# Patient Record
Sex: Male | Born: 2000 | Race: White | Hispanic: No | Marital: Single | Smoking: Never smoker
Health system: Southern US, Community
[De-identification: ages and names within clinical notes are randomized; demographics above are authoritative.]

## PROBLEM LIST (undated history)

## (undated) HISTORY — PX: TYMPANOSTOMY TUBE PLACEMENT: SHX32

---
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
## 2021-02-28 DIAGNOSIS — Z419 Encounter for procedure for purposes other than remedying health state, unspecified: Secondary | ICD-10-CM | POA: Diagnosis not present

## 2021-03-31 DIAGNOSIS — Z419 Encounter for procedure for purposes other than remedying health state, unspecified: Secondary | ICD-10-CM | POA: Diagnosis not present

## 2021-05-01 DIAGNOSIS — Z419 Encounter for procedure for purposes other than remedying health state, unspecified: Secondary | ICD-10-CM | POA: Diagnosis not present

## 2021-05-31 DIAGNOSIS — Z419 Encounter for procedure for purposes other than remedying health state, unspecified: Secondary | ICD-10-CM | POA: Diagnosis not present

## 2021-07-01 DIAGNOSIS — Z419 Encounter for procedure for purposes other than remedying health state, unspecified: Secondary | ICD-10-CM | POA: Diagnosis not present

## 2021-07-31 DIAGNOSIS — Z419 Encounter for procedure for purposes other than remedying health state, unspecified: Secondary | ICD-10-CM | POA: Diagnosis not present

## 2021-08-31 DIAGNOSIS — Z419 Encounter for procedure for purposes other than remedying health state, unspecified: Secondary | ICD-10-CM | POA: Diagnosis not present

## 2021-10-01 DIAGNOSIS — Z419 Encounter for procedure for purposes other than remedying health state, unspecified: Secondary | ICD-10-CM | POA: Diagnosis not present

## 2021-10-29 DIAGNOSIS — Z419 Encounter for procedure for purposes other than remedying health state, unspecified: Secondary | ICD-10-CM | POA: Diagnosis not present

## 2021-11-29 DIAGNOSIS — Z419 Encounter for procedure for purposes other than remedying health state, unspecified: Secondary | ICD-10-CM | POA: Diagnosis not present

## 2021-12-29 DIAGNOSIS — Z419 Encounter for procedure for purposes other than remedying health state, unspecified: Secondary | ICD-10-CM | POA: Diagnosis not present

## 2022-01-29 DIAGNOSIS — Z419 Encounter for procedure for purposes other than remedying health state, unspecified: Secondary | ICD-10-CM | POA: Diagnosis not present

## 2022-02-28 DIAGNOSIS — Z419 Encounter for procedure for purposes other than remedying health state, unspecified: Secondary | ICD-10-CM | POA: Diagnosis not present

## 2022-03-31 DIAGNOSIS — Z419 Encounter for procedure for purposes other than remedying health state, unspecified: Secondary | ICD-10-CM | POA: Diagnosis not present

## 2022-05-01 DIAGNOSIS — Z419 Encounter for procedure for purposes other than remedying health state, unspecified: Secondary | ICD-10-CM | POA: Diagnosis not present

## 2022-05-31 DIAGNOSIS — Z419 Encounter for procedure for purposes other than remedying health state, unspecified: Secondary | ICD-10-CM | POA: Diagnosis not present

## 2022-07-01 DIAGNOSIS — Z419 Encounter for procedure for purposes other than remedying health state, unspecified: Secondary | ICD-10-CM | POA: Diagnosis not present

## 2022-07-31 DIAGNOSIS — Z419 Encounter for procedure for purposes other than remedying health state, unspecified: Secondary | ICD-10-CM | POA: Diagnosis not present

## 2022-08-31 DIAGNOSIS — Z419 Encounter for procedure for purposes other than remedying health state, unspecified: Secondary | ICD-10-CM | POA: Diagnosis not present

## 2022-10-01 DIAGNOSIS — Z419 Encounter for procedure for purposes other than remedying health state, unspecified: Secondary | ICD-10-CM | POA: Diagnosis not present

## 2022-10-30 DIAGNOSIS — Z419 Encounter for procedure for purposes other than remedying health state, unspecified: Secondary | ICD-10-CM | POA: Diagnosis not present

## 2022-11-30 DIAGNOSIS — Z419 Encounter for procedure for purposes other than remedying health state, unspecified: Secondary | ICD-10-CM | POA: Diagnosis not present

## 2022-12-30 DIAGNOSIS — Z419 Encounter for procedure for purposes other than remedying health state, unspecified: Secondary | ICD-10-CM | POA: Diagnosis not present

## 2023-01-30 DIAGNOSIS — Z419 Encounter for procedure for purposes other than remedying health state, unspecified: Secondary | ICD-10-CM | POA: Diagnosis not present

## 2023-03-01 DIAGNOSIS — Z419 Encounter for procedure for purposes other than remedying health state, unspecified: Secondary | ICD-10-CM | POA: Diagnosis not present

## 2023-04-01 DIAGNOSIS — Z419 Encounter for procedure for purposes other than remedying health state, unspecified: Secondary | ICD-10-CM | POA: Diagnosis not present

## 2023-05-02 DIAGNOSIS — Z419 Encounter for procedure for purposes other than remedying health state, unspecified: Secondary | ICD-10-CM | POA: Diagnosis not present

## 2023-06-01 DIAGNOSIS — Z419 Encounter for procedure for purposes other than remedying health state, unspecified: Secondary | ICD-10-CM | POA: Diagnosis not present

## 2023-07-02 DIAGNOSIS — Z419 Encounter for procedure for purposes other than remedying health state, unspecified: Secondary | ICD-10-CM | POA: Diagnosis not present

## 2023-08-01 DIAGNOSIS — Z419 Encounter for procedure for purposes other than remedying health state, unspecified: Secondary | ICD-10-CM | POA: Diagnosis not present

## 2023-09-01 DIAGNOSIS — Z419 Encounter for procedure for purposes other than remedying health state, unspecified: Secondary | ICD-10-CM | POA: Diagnosis not present

## 2023-10-02 DIAGNOSIS — Z419 Encounter for procedure for purposes other than remedying health state, unspecified: Secondary | ICD-10-CM | POA: Diagnosis not present

## 2023-10-30 DIAGNOSIS — Z419 Encounter for procedure for purposes other than remedying health state, unspecified: Secondary | ICD-10-CM | POA: Diagnosis not present

## 2023-12-11 DIAGNOSIS — Z419 Encounter for procedure for purposes other than remedying health state, unspecified: Secondary | ICD-10-CM | POA: Diagnosis not present

## 2024-01-10 DIAGNOSIS — Z419 Encounter for procedure for purposes other than remedying health state, unspecified: Secondary | ICD-10-CM | POA: Diagnosis not present

## 2024-02-10 DIAGNOSIS — Z419 Encounter for procedure for purposes other than remedying health state, unspecified: Secondary | ICD-10-CM | POA: Diagnosis not present

## 2024-03-11 DIAGNOSIS — Z419 Encounter for procedure for purposes other than remedying health state, unspecified: Secondary | ICD-10-CM | POA: Diagnosis not present

## 2024-04-11 DIAGNOSIS — Z419 Encounter for procedure for purposes other than remedying health state, unspecified: Secondary | ICD-10-CM | POA: Diagnosis not present

## 2024-05-12 DIAGNOSIS — Z419 Encounter for procedure for purposes other than remedying health state, unspecified: Secondary | ICD-10-CM | POA: Diagnosis not present

## 2024-06-11 DIAGNOSIS — Z419 Encounter for procedure for purposes other than remedying health state, unspecified: Secondary | ICD-10-CM | POA: Diagnosis not present

## 2024-07-12 DIAGNOSIS — Z419 Encounter for procedure for purposes other than remedying health state, unspecified: Secondary | ICD-10-CM | POA: Diagnosis not present
